# Patient Record
Sex: Female | Born: 1995 | Smoking: Never smoker
Health system: Southern US, Community
[De-identification: ages and names within clinical notes are randomized; demographics above are authoritative.]

## PROBLEM LIST (undated history)

## (undated) DIAGNOSIS — Z86718 Personal history of other venous thrombosis and embolism: Secondary | ICD-10-CM

## (undated) DIAGNOSIS — I2699 Other pulmonary embolism without acute cor pulmonale: Secondary | ICD-10-CM

## (undated) DIAGNOSIS — F909 Attention-deficit hyperactivity disorder, unspecified type: Secondary | ICD-10-CM

## (undated) HISTORY — PX: WRIST SURGERY: SHX841

## (undated) HISTORY — PX: TONSILLECTOMY: SUR1361

## (undated) HISTORY — PX: KNEE SURGERY: SHX244

---

## 2020-01-16 ENCOUNTER — Ambulatory Visit: Payer: Self-pay | Attending: Family

## 2020-01-16 DIAGNOSIS — Z23 Encounter for immunization: Secondary | ICD-10-CM

## 2020-02-12 NOTE — Progress Notes (Signed)
   Covid-19 Vaccination Clinic  Name:  Misty Long    MRN: [id number] DOB: October 04, 1995  02/12/2020  Ms. Misty Long was observed post Covid-19 immunization for 15 minutes without incident. She was provided with Vaccine Information Sheet and instruction to access the V-Safe system.   [name] was instructed to call 911 with any severe reactions post vaccine: Marland Kitchen Difficulty breathing  . Swelling of face and throat  . A fast heartbeat  . A bad rash all over body  . Dizziness and weakness   Immunizations Administered    Name Date Dose VIS Date Route   Moderna COVID-19 Vaccine 01/16/2020  4:00 PM 0.5 mL 03/2019 Intramuscular   Manufacturer: Moderna   Lot: [id number]   NDC: 10272-536-64

## 2020-05-16 ENCOUNTER — Encounter (HOSPITAL_COMMUNITY): Payer: Self-pay

## 2020-05-16 ENCOUNTER — Ambulatory Visit (HOSPITAL_COMMUNITY)
Admission: EM | Admit: 2020-05-16 | Discharge: 2020-05-16 | Disposition: A | Discharge status: Home / Self Care | Payer: 59 | Attending: Emergency Medicine | Admitting: Emergency Medicine

## 2020-05-16 ENCOUNTER — Other Ambulatory Visit: Payer: Self-pay

## 2020-05-16 DIAGNOSIS — S81812A Laceration without foreign body, left lower leg, initial encounter: Secondary | ICD-10-CM

## 2020-05-16 HISTORY — DX: Attention-deficit hyperactivity disorder, unspecified type: F90.9

## 2020-05-16 HISTORY — DX: Personal history of other venous thrombosis and embolism: Z86.718

## 2020-05-16 MED ORDER — IBUPROFEN 600 MG PO TABS: 600.0000 mg | ORAL_TABLET | Freq: Four times a day (QID) | ORAL | 0 refills | Status: DC | PRN

## 2020-05-16 NOTE — ED Triage Notes (Signed)
Pt presents with abrasion on right leg and right leg pain after banging it against a picture frame while trying to do a handstand against the wall.

## 2020-05-16 NOTE — ED Notes (Signed)
Ice pack provided for left leg pain.

## 2020-05-16 NOTE — ED Provider Notes (Signed)
HPI  SUBJECTIVE:  Misty Long is a 25 y.o. female who presents with a painful laceration and swelling to her distal left lower extremity after trying to do a handstand against the wall yesterday.  She states that she hit her leg against the corner of a wooden picture frame.  She is not sure if the picture frame is intact.  She states that she "feels as if there is something in there" because of the intensity of the pain.  She describes the pain is constant, worse with dorsiflexion/plantarflexion, palpation.  No numbness or tingling distally, no bruising.  She tried hydrogen peroxide, Aquaphor ointment and ice.  The ice helps.  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  She states she now takes Eliquis intermittently.  No recent Eliquis use.  No history of diabetes, HIV.  All immunizations are up-to-date.  Tetanus was within the past 10 years.  LMP: 2 weeks ago.  Denies the possibility being pregnant.  XTG:GYIRSW, Adija D, PA-C   Past Medical History:  Diagnosis Date  . ADHD   . H/O blood clots     Past Surgical History:  Procedure Laterality Date  . KNEE SURGERY    . TONSILLECTOMY    . WRIST SURGERY      Family History  Family history unknown: Yes    Social History   Tobacco Use  . Smoking status: Current Every Day Smoker  Substance Use Topics  . Drug use: Yes    Types: Marijuana    No current facility-administered medications for this encounter.  Current Outpatient Medications:  .  amphetamine-dextroamphetamine (ADDERALL XR) 10 MG 24 hr capsule, Take 15 mg by mouth daily., Disp: , Rfl:  .  ibuprofen (ADVIL) 600 MG tablet, Take 1 tablet (600 mg total) by mouth every 6 (six) hours as needed., Disp: 30 tablet, Rfl: 0  Allergies  Allergen Reactions  . Codeine      ROS  As noted in HPI.   Physical Exam  BP 114/66 (BP Location: Right Arm)   Pulse  80   Temp 98.3 F (36.8 C) (Oral)   Resp 20   LMP 05/03/2020   SpO2 100%   Constitutional: Well developed, well nourished, no acute distress Eyes:  EOMI, conjunctiva normal bilaterally HENT: Normocephalic, atraumatic,mucus membranes moist Respiratory: Normal inspiratory effort Cardiovascular: Normal rate GI: nondistended skin: 0.5 cm tender laceration that is partially healed distal anterior left lower extremity.  Explored under magnification.  No appreciable foreign body seen.  No expressible purulent drainage.  No surrounding erythema, induration, ecchymosis.  No increased temperature.  Calves grossly symmetric.      Musculoskeletal: no deformities Neurologic: Alert & oriented x 3, no focal neuro deficits Psychiatric: Speech and behavior appropriate   ED Course   Medications - No data to display  Orders Placed This Encounter  Procedures  . Apply ice to affected area    Standing Status:   Standing    Number of Occurrences:   1    No results found for this or any previous visit (from the past 24 hour(s)). No results found.  ED Clinical Impression  1. Laceration of left lower extremity, initial encounter      ED Assessment/Plan  Patient with a laceration to her left lower extremity.  Doubt resulting compartment syndrome as she is not on any anticoagulants.  Patient up-to-date  on tetanus. There is no evidence of infection.  Did not incise open and explore as it is well on its way to being healed.  There is no surrounding erythema, increased temperature that would suggest foreign body presence.  Deferred antibiotic therapy.  Will send home with ice, Tylenol/ibuprofen.  Giving ice pack here.  Return to clinic for any signs of infection.  Will reevaluate for presence of foreign body at that time.  Discussed  MDM, treatment plan, and plan for follow-up with patient. patient agrees with plan.   Meds ordered this encounter  Medications  . ibuprofen (ADVIL) 600 MG tablet     Sig: Take 1 tablet (600 mg total) by mouth every 6 (six) hours as needed.    Dispense:  30 tablet    Refill:  0    *This clinic note was created using Scientist, clinical (histocompatibility and immunogenetics). Therefore, there may be occasional mistakes despite careful proofreading.   ?    Domenick Gong, MD 05/17/20 [phone]

## 2020-05-16 NOTE — Discharge Instructions (Signed)
Keep clean with soap and water.  Ice for 20 minutes at a time.  Take 600 mg of ibuprofen combined with 1000 mg of Tylenol together 3-4 times a day as needed for pain.  Return here for any signs of infection.

## 2020-05-23 ENCOUNTER — Ambulatory Visit (HOSPITAL_COMMUNITY)
Admission: EM | Admit: 2020-05-23 | Discharge: 2020-05-23 | Disposition: A | Discharge status: Home / Self Care | Payer: Worker's Compensation | Attending: Emergency Medicine | Admitting: Emergency Medicine

## 2020-05-23 ENCOUNTER — Other Ambulatory Visit: Payer: Self-pay

## 2020-05-23 ENCOUNTER — Encounter (HOSPITAL_COMMUNITY): Payer: Self-pay

## 2020-05-23 DIAGNOSIS — W19XXXA Unspecified fall, initial encounter: Secondary | ICD-10-CM

## 2020-05-23 DIAGNOSIS — M7918 Myalgia, other site: Secondary | ICD-10-CM | POA: Diagnosis not present

## 2020-05-23 MED ORDER — METHOCARBAMOL 500 MG PO TABS: 500.0000 mg | ORAL_TABLET | Freq: Two times a day (BID) | ORAL | 0 refills | Status: DC

## 2020-05-23 NOTE — ED Triage Notes (Signed)
Pt present a fall today at work, pt states that she hurt her right side hip and hand. Pt state this Workers comp.

## 2020-05-23 NOTE — Discharge Instructions (Signed)
Take ibuprofen as needed for discomfort.    Take the muscle relaxer as needed for muscle spasm; Do not drive, operate machinery, or drink alcohol with this medication as it can cause drowsiness.   Follow up with your primary care provider if your symptoms are not improving.     

## 2020-05-23 NOTE — ED Provider Notes (Signed)
[facility]    CSN: [id number] Arrival date & time: 05/23/20  1416      History   Chief Complaint Chief Complaint  Patient presents with  . Fall    WC    HPI Misty Long is a 25 y.o. female.   Patient presents with pain in her right hip and thigh after falling at work today.  She states she slipped on a wet floor and fell landing on her right side.  She denies head injury or loss of consciousness.  No treatments attempted at home.  She denies numbness, weakness, paresthesias, wounds, redness, bruising, chest pain, shortness of breath, abdominal pain, or other symptoms.  Her medical history includes ADHD and pulmonary embolism in 2020.  The history is provided by the patient and medical records.    Past Medical History:  Diagnosis Date  . ADHD   . H/O blood clots     There are no problems to display for this patient.   Past Surgical History:  Procedure Laterality Date  . KNEE SURGERY    . TONSILLECTOMY    . WRIST SURGERY      OB History   No obstetric history on file.      Home Medications    Prior to Admission medications   Medication Sig Start Date End Date Taking? Authorizing Provider  methocarbamol (ROBAXIN) 500 MG tablet Take 1 tablet (500 mg total) by mouth 2 (two) times daily. 05/23/20  Yes Mickie Bail, NP  amphetamine-dextroamphetamine (ADDERALL XR) 10 MG 24 hr capsule Take 15 mg by mouth daily.    [provider]  ibuprofen (ADVIL) 600 MG tablet Take 1 tablet (600 mg total) by mouth every 6 (six) hours as needed. 05/16/20   Domenick Gong, MD    Family History Family History  Family history unknown: Yes    Social History Social History   Tobacco Use  . Smoking status: Current Every Day Smoker  Substance Use Topics  . Drug use: Yes    Types: Marijuana     Allergies   Codeine   Review of Systems Review of Systems  Constitutional: Negative for chills and fever.  HENT: Negative for ear pain and sore throat.    Eyes: Negative for pain and visual disturbance.  Respiratory: Negative for cough and shortness of breath.   Cardiovascular: Negative for chest pain and palpitations.  Gastrointestinal: Negative for abdominal pain and vomiting.  Genitourinary: Negative for dysuria and hematuria.  Musculoskeletal: Positive for arthralgias and myalgias. Negative for back pain, gait problem and joint swelling.  Skin: Negative for color change and rash.  Neurological: Negative for syncope, weakness and numbness.  All other systems reviewed and are negative.    Physical Exam Triage Vital Signs ED Triage Vitals  Enc Vitals Group     BP      Pulse      Resp      Temp      Temp src      SpO2      Weight      Height      Head Circumference      Peak Flow      Pain Score      Pain Loc      Pain Edu?      Excl. in GC?    No data found.  Updated Vital Signs BP 123/71 (BP Location: Left Arm)   Pulse 70   Temp 98.9 F (37.2 C) (Oral)  Resp 18   LMP 05/03/2020   SpO2 100%   Visual Acuity Right Eye Distance:   Left Eye Distance:   Bilateral Distance:    Right Eye Near:   Left Eye Near:    Bilateral Near:     Physical Exam Vitals and nursing note reviewed.  Constitutional:      General: She is not in acute distress.    Appearance: She is well-developed and well-nourished. She is obese. She is not ill-appearing.  HENT:     Head: Normocephalic and atraumatic.     Mouth/Throat:     Mouth: Mucous membranes are moist.  Eyes:     Conjunctiva/sclera: Conjunctivae normal.  Cardiovascular:     Rate and Rhythm: Normal rate and regular rhythm.     Heart sounds: Normal heart sounds.  Pulmonary:     Effort: Pulmonary effort is normal. No respiratory distress.     Breath sounds: Normal breath sounds.  Abdominal:     Palpations: Abdomen is soft.     Tenderness: There is no abdominal tenderness. There is no right CVA tenderness, left CVA tenderness, guarding or rebound.  Musculoskeletal:         General: No swelling, tenderness, deformity or edema. Normal range of motion.     Cervical back: Neck supple.  Skin:    General: Skin is warm and dry.     Findings: No bruising, erythema, lesion or rash.  Neurological:     General: No focal deficit present.     Mental Status: She is alert and oriented to person, place, and time.     Sensory: No sensory deficit.     Motor: No weakness.     Gait: Gait normal.  Psychiatric:        Mood and Affect: Mood and affect and mood normal.        Behavior: Behavior normal.      UC Treatments / Results  Labs (all labs ordered are listed, but only abnormal results are displayed) Labs Reviewed - No data to display  EKG   Radiology No results found.  Procedures Procedures (including critical care time)  Medications Ordered in UC Medications - No data to display  Initial Impression / Assessment and Plan / UC Course  I have reviewed the triage vital signs and the nursing notes.  Pertinent labs & imaging results that were available during my care of the patient were reviewed by me and considered in my medical decision making (see chart for details).   Musculoskeletal pain due to fall.  Patient declines prescription for ibuprofen; she states she has some at home.  Instructed her to take ibuprofen as needed for discomfort.  Also treating with methocarbamol; precautions for drowsiness with this medication discussed.  Instructed patient to follow-up with her PCP if her symptoms are not improving.  She agrees to plan of care.   Final Clinical Impressions(s) / UC Diagnoses   Final diagnoses:  Musculoskeletal pain  Fall, initial encounter     Discharge Instructions     Take ibuprofen as needed for discomfort.  Take the muscle relaxer as needed for muscle spasm; Do not drive, operate machinery, or drink alcohol with this medication as it can cause drowsiness.   Follow up with your primary care provider if your symptoms are not improving.         ED Prescriptions    Medication Sig Dispense Auth. Provider   methocarbamol (ROBAXIN) 500 MG tablet Take 1 tablet (500 mg total) by  mouth 2 (two) times daily. 20 tablet Mickie Bail, NP     PDMP not reviewed this encounter.   Mickie Bail, NP 05/23/20 [phone]

## 2020-08-25 ENCOUNTER — Encounter: Payer: Self-pay | Admitting: Family Medicine

## 2020-08-25 ENCOUNTER — Other Ambulatory Visit: Payer: Self-pay

## 2020-08-25 ENCOUNTER — Ambulatory Visit (INDEPENDENT_AMBULATORY_CARE_PROVIDER_SITE_OTHER): Payer: 59 | Admitting: Family Medicine

## 2020-08-25 VITALS — BP 124/81 | Ht 65.0 in | Wt 255.0 lb

## 2020-08-25 DIAGNOSIS — S83242A Other tear of medial meniscus, current injury, left knee, initial encounter: Secondary | ICD-10-CM

## 2020-08-25 NOTE — Progress Notes (Addendum)
PCP: Tally Joe, PA-C  Subjective:   HPI: Patient is a 25 y.o. female with history of left medial meniscus tear status post partial meniscectomy during high school here for evaluation of left medial knee pain and swelling.  She reports that recently she started doing a new exercise routine and has been going to the gym more.  Part of her exercise program was doing a lot of deep squats and explosive exercises with lower body.  She did not note a specific incident that seem to set off her pain, however the day after she was doing side exercise, she woke up and had significant pain and swelling in her left knee.  The pain is mostly on the medial aspect of her knee, and since it started a couple weeks ago, she has had clicking and occasionally her knee gets stuck in a certain position and she has to maneuver her knee in a certain way to get it to bend again.  She is able to walk but does have pain with walking as well.  She has tried oral anti-inflammatories which were mildly helpful, massage, and relative rest.   Review of Systems:  Per HPI.   PMFSH, medications and smoking status reviewed.      Objective:  Physical Exam:  No flowsheet data found.   Gen: awake, alert, NAD, comfortable in exam room Pulm: breathing unlabored  Left knee  Inspection: Somewhat difficult to appreciate due to body habitus.  Bilateral knees without evidence of erythema, ecchymosis, swelling, edema. No significant effusion visible. Active ROM: Intact. 0-150d.  Pain with deep flexion. Strength: 5/5 strength to resisted flexion/extension with pain with deep flexion. Patella: Negative patellar grind. No patellar facet tenderness. No apprehension. No proximal or distal patellar tendon tenderness to palpation. No quad tendon tenderness to palpation.  Tibia: No tibial plateau, tibial tuberosity tenderness.  Joint line: + Medial joint line tenderness.  Popliteal: No popliteal tenderness to the insertional gastroc.  No insertional biceps femoris, semimembranosis, semitendinosis tenderness.  McMurrays/Thessaly's: Positive for pain and catching along the medial joint line Lachmans: Stable bilaterally with firm endpoint  Anterior/Posterior drawer: Stable bilaterally Varus/valgus stress at 0, 15d: Negative for pain, laxity  Limited ultrasound examination of the left knee: -Suprapatellar pouch: Visualized in both long and short axis and does show a mild effusion. -Quadriceps tendon: Insertion onto superior pole patella visualized and without significant abnormality -Medial joint line: Medial meniscus visualized.  There is narrowing of the medial joint line with a small osteophyte at the area of previous meniscal surgery.  She does have some extrusion of the medial meniscus without any visible discrete tear.  Impression: -Left knee effusion -Abnormal meniscal findings concerning for meniscus tear versus postoperative changes  Ultrasound performed interpreted by Guy Sandifer, MD, and Norton Blizzard, MD.        Assessment & Plan:  1.  Acute traumatic left medial meniscus tear Patient with history and examination concerning for medial meniscus tear.  This is complicated by history of medial meniscus tear of the same knee status post partial meniscectomy, however she is currently having mechanical symptoms and MRI is warranted.  Plan: -X-ray left knee -MRI left knee -Avoid deep squatting, pivoting, or aggravating activities -Tylenol, ibuprofen and ice in the short-term -I will call patient with results of MRI and x-ray.   Guy Sandifer, MD [facility] Fellow 08/25/2020 4:27 PM  Addendum:  I was the preceptor for this visit and available for immediate consultation.  Norton Blizzard MD  CAQSM  Addendum 09/10/2020: Reviewed MRI results with patient which showed fraying of the posterior horn of the lateral meniscus without discrete tear, tricompartmental arthritis and a joint effusion.  We  discussed that these findings can occur post meniscectomy it does not appear that there is any surgical intervention required at this point.  She feels reassured about this, we discussed that she can return anytime for nonoperative pain management strategies as desired.  She states that she has been resting since our visit and feels like her pain is improved, so she will manage on her own right now which I think is reasonable.  All questions answered.  Guy Sandifer, MD Sports Medicine Fellow, [facility]

## 2020-08-27 ENCOUNTER — Other Ambulatory Visit: Payer: Self-pay

## 2020-08-27 ENCOUNTER — Ambulatory Visit
Admission: RE | Admit: 2020-08-27 | Discharge: 2020-08-27 | Disposition: A | Discharge status: Home / Self Care | Payer: 59 | Source: Ambulatory Visit | Attending: Family Medicine | Admitting: Family Medicine

## 2020-08-27 DIAGNOSIS — S83242A Other tear of medial meniscus, current injury, left knee, initial encounter: Secondary | ICD-10-CM

## 2020-09-04 ENCOUNTER — Other Ambulatory Visit: Payer: 59

## 2020-09-08 ENCOUNTER — Other Ambulatory Visit: Payer: Self-pay

## 2020-09-08 ENCOUNTER — Ambulatory Visit
Admission: RE | Admit: 2020-09-08 | Discharge: 2020-09-08 | Disposition: A | Discharge status: Home / Self Care | Payer: 59 | Source: Ambulatory Visit | Attending: Family Medicine | Admitting: Family Medicine

## 2020-09-08 DIAGNOSIS — S83242A Other tear of medial meniscus, current injury, left knee, initial encounter: Secondary | ICD-10-CM

## 2021-05-03 ENCOUNTER — Ambulatory Visit: Payer: 59 | Admitting: Family Medicine

## 2021-05-03 NOTE — Progress Notes (Signed)
Opened in error

## 2021-07-02 ENCOUNTER — Other Ambulatory Visit: Payer: Self-pay

## 2021-07-02 ENCOUNTER — Ambulatory Visit (INDEPENDENT_AMBULATORY_CARE_PROVIDER_SITE_OTHER): Payer: 59

## 2021-07-02 ENCOUNTER — Encounter (HOSPITAL_COMMUNITY): Payer: Self-pay | Admitting: Emergency Medicine

## 2021-07-02 ENCOUNTER — Ambulatory Visit (HOSPITAL_COMMUNITY)
Admission: EM | Admit: 2021-07-02 | Discharge: 2021-07-02 | Disposition: A | Discharge status: Home / Self Care | Payer: 59 | Attending: Family Medicine | Admitting: Family Medicine

## 2021-07-02 DIAGNOSIS — R062 Wheezing: Secondary | ICD-10-CM

## 2021-07-02 DIAGNOSIS — R051 Acute cough: Secondary | ICD-10-CM

## 2021-07-02 DIAGNOSIS — R059 Cough, unspecified: Secondary | ICD-10-CM

## 2021-07-02 HISTORY — DX: Other pulmonary embolism without acute cor pulmonale: I26.99

## 2021-07-02 MED ORDER — PREDNISONE 20 MG PO TABS: 40.0000 mg | ORAL_TABLET | Freq: Every day | ORAL | 0 refills | Status: DC

## 2021-07-02 MED ORDER — AZITHROMYCIN 250 MG PO TABS: 250.0000 mg | ORAL_TABLET | Freq: Every day | ORAL | 0 refills | Status: DC

## 2021-07-02 NOTE — ED Triage Notes (Signed)
Onset of symptoms 10 days ago.   ?Patient was sick prior to trip to [place], but symptoms may have worsened.  Patient has runny nose, cough, coughing episodes.  Overall feeling tired.    ?

## 2021-07-04 NOTE — ED Provider Notes (Signed)
?[facility] ? ? ?[id number] ?07/02/21 Arrival Time: 1258 ? ?ASSESSMENT & PLAN: ? ?1. Acute cough   ?2. Wheezing   ? ?No resp distress. ?I have personally viewed the imaging studies ordered this visit. ?No acute changes appreciated. But given duration of symptoms will treat with: ?Discharge Medication List as of 07/02/2021  2:42 PM  ?  ? ?START taking these medications  ? Details  ?azithromycin (ZITHROMAX) 250 MG tablet Take 1 tablet (250 mg total) by mouth daily. Take first 2 tablets together, then 1 every day until finished., Starting Sat 07/02/2021, Normal  ?  ?predniSONE (DELTASONE) 20 MG tablet Take 2 tablets (40 mg total) by mouth daily., Starting Sat 07/02/2021, Normal  ?  ?  ? ? ? Follow-up Information   ? ? Tally Joe, PA-C.   ?Specialty: Physician Assistant ?Why: If worsening or failing to improve as anticipated. ?Contact information: ?[address] ?[address] ?[place] [address] ?[phone] ? ? ?  ?  ? ?  ?  ? ?  ? ? ?Reviewed expectations re: course of current medical issues. Questions answered. ?Outlined signs and symptoms indicating need for more acute intervention. ?Understanding verbalized. ?After Visit Summary given. ? ? ?SUBJECTIVE: ?History from: Patient. ?Misty Long is a 26 y.o. female. Reports: coughing and congested; x 10 days; no better; overall fatigued. Ques subj fever/chills over past few days. Denies: difficulty breathing. Normal PO intake without n/v/d. ? ?Social History  ? ?Tobacco Use  ?Smoking Status Never  ?Smokeless Tobacco Never  ? ? ?OBJECTIVE: ? ?Vitals:  ? 07/02/21 1343  ?BP: 124/72  ?Pulse: 70  ?Resp: 20  ?Temp: 98.9 ?F (37.2 ?C)  ?TempSrc: Oral  ?SpO2: 95%  ?  ?General appearance: alert; no distress ?Eyes: PERRLA; EOMI; conjunctiva normal ?HENT: [place]; AT; with nasal congestion ?Neck: supple  ?Lungs: speaks full sentences without difficulty; unlabored; slightly coarse breath sounds bilat; mild exp wheezing ?Extremities: no edema ?Skin: warm and  dry ?Neurologic: normal gait ?Psychological: alert and cooperative; normal mood and affect ? ? ?Imaging: ?DG Chest 2 View ? ?Result Date: 07/02/2021 ?CLINICAL DATA:  Persistent cough, worsened symptoms over 10 days EXAM: CHEST - 2 VIEW COMPARISON:  None FINDINGS: Normal heart size, mediastinal contours, and pulmonary vascularity. Lungs clear. No pulmonary infiltrate, pleural effusion, or pneumothorax. Osseous structures unremarkable. IMPRESSION: No acute abnormalities. Electronically Signed   By: Ulyses Southward M.D.   On: 07/02/2021 14:18  ? ? ?Allergies  ?Allergen Reactions  ? Codeine   ? ? ?Past Medical History:  ?Diagnosis Date  ? ADHD   ? H/O blood clots   ? PE (pulmonary thromboembolism) (HCC)   ? ?Social History  ? ?Socioeconomic History  ? Marital status: Unknown  ?  Spouse name: Not on file  ? Number of children: Not on file  ? Years of education: Not on file  ? Highest education level: Not on file  ?Occupational History  ? Not on file  ?Tobacco Use  ? Smoking status: Never  ? Smokeless tobacco: Never  ?Vaping Use  ? Vaping Use: Never used  ?Substance and Sexual Activity  ? Alcohol use: Yes  ? Drug use: Yes  ?  Types: Marijuana  ? Sexual activity: Not on file  ?Other Topics Concern  ? Not on file  ?Social History Narrative  ? Not on file  ? ?Social Determinants of Health  ? ?Financial Resource Strain: Not on file  ?Food Insecurity: Not on file  ?Transportation Needs: Not on file  ?  Physical Activity: Not on file  ?Stress: Not on file  ?Social Connections: Not on file  ?Intimate Partner Violence: Not on file  ? ?Family History  ?Problem Relation Age of Onset  ? Healthy Mother   ? Healthy Father   ? ?Past Surgical History:  ?Procedure Laterality Date  ? KNEE SURGERY    ? TONSILLECTOMY    ? WRIST SURGERY    ? ?  ?Mardella Layman, MD ?07/04/21 [phone] ? ?

## 2021-08-25 ENCOUNTER — Other Ambulatory Visit: Payer: Self-pay

## 2021-08-25 ENCOUNTER — Encounter (HOSPITAL_COMMUNITY): Payer: Self-pay | Admitting: Emergency Medicine

## 2021-08-25 ENCOUNTER — Emergency Department (HOSPITAL_COMMUNITY): Payer: Commercial Managed Care - PPO

## 2021-08-25 ENCOUNTER — Emergency Department (HOSPITAL_COMMUNITY)
Admission: EM | Admit: 2021-08-25 | Discharge: 2021-08-26 | Discharge status: Against Medical Advice | Payer: Commercial Managed Care - PPO | Attending: Emergency Medicine | Admitting: Emergency Medicine

## 2021-08-25 DIAGNOSIS — M25561 Pain in right knee: Secondary | ICD-10-CM | POA: Diagnosis not present

## 2021-08-25 DIAGNOSIS — M79661 Pain in right lower leg: Secondary | ICD-10-CM | POA: Insufficient documentation

## 2021-08-25 DIAGNOSIS — Z5321 Procedure and treatment not carried out due to patient leaving prior to being seen by health care provider: Secondary | ICD-10-CM | POA: Diagnosis not present

## 2021-08-25 MED ORDER — IBUPROFEN 400 MG PO TABS
400.0000 mg | ORAL_TABLET | Freq: Once | ORAL | Status: AC | PRN
  Administered 2021-08-25: 400 mg via ORAL
  Filled 2021-08-25: qty 1

## 2021-08-25 NOTE — ED Triage Notes (Signed)
Pt reported to ED for evaluation of paint to right leg  that occurred while playing football. states "I popped something in the middle of my calf and the pain is radiating to my ankle".  ?

## 2021-08-25 NOTE — ED Provider Triage Note (Signed)
Emergency Medicine Provider Triage Evaluation Note ? ?Dashonda Bonneau , a 26 y.o. female  was evaluated in triage.  Pt complains of right lower extremity pain to knee and ankle.  Happened today at a power puff game, patient landed wrong on her foot denies being tackled or hitting her head.  Pain is excruciating per patient. ? ?Review of Systems  ?Per hpi ? ?Physical Exam  ?BP 134/80 (BP Location: Left Arm)   Pulse (!) 113   Temp 98.5 ?F (36.9 ?C) (Oral)   Resp 17   SpO2 97%  ?Gen:   Awake, no distress patient hyperventilating and writhing in pain in room ?Resp:  Normal effort  ?MSK:   Will not tolerate range of motion to right ankle or knee.  Rising screams in pain with any palpation of the right lower extremity. ?Other:  ? ?Medical Decision Making  ?Medically screening exam initiated at 10:01 PM.  Appropriate orders placed.  Aalia Greulich was informed that the remainder of the evaluation will be completed by another provider, this initial triage assessment does not replace that evaluation, and the importance of remaining in the ED until their evaluation is complete. ? ?Imaging  ?  ?Theron Arista, PA-C ?08/25/21 2202 ? ?

## 2021-08-26 NOTE — ED Notes (Signed)
Patient states she is leaving d/t wait time 

## 2021-10-15 ENCOUNTER — Ambulatory Visit (HOSPITAL_COMMUNITY)
Admission: EM | Admit: 2021-10-15 | Discharge: 2021-10-15 | Disposition: A | Discharge status: Home / Self Care | Payer: Commercial Managed Care - PPO | Attending: Emergency Medicine | Admitting: Emergency Medicine

## 2021-10-15 ENCOUNTER — Encounter (HOSPITAL_COMMUNITY): Payer: Self-pay | Admitting: Emergency Medicine

## 2021-10-15 DIAGNOSIS — S86092D Other specified injury of left Achilles tendon, subsequent encounter: Secondary | ICD-10-CM

## 2021-10-15 DIAGNOSIS — L299 Pruritus, unspecified: Secondary | ICD-10-CM | POA: Diagnosis not present

## 2021-10-15 DIAGNOSIS — Z9889 Other specified postprocedural states: Secondary | ICD-10-CM

## 2021-10-15 DIAGNOSIS — Z5189 Encounter for other specified aftercare: Secondary | ICD-10-CM

## 2021-10-15 MED ORDER — CETIRIZINE HCL 10 MG PO TABS: 10.0000 mg | ORAL_TABLET | Freq: Every day | ORAL | 0 refills | Status: DC

## 2021-10-15 MED ORDER — CEPHALEXIN 500 MG PO CAPS: 500.0000 mg | ORAL_CAPSULE | Freq: Two times a day (BID) | ORAL | 0 refills | Status: AC

## 2022-02-28 ENCOUNTER — Encounter (HOSPITAL_COMMUNITY): Payer: Self-pay | Admitting: Emergency Medicine

## 2022-02-28 ENCOUNTER — Other Ambulatory Visit: Payer: Self-pay

## 2022-02-28 ENCOUNTER — Ambulatory Visit (HOSPITAL_COMMUNITY)
Admission: EM | Admit: 2022-02-28 | Discharge: 2022-02-28 | Disposition: A | Discharge status: Home / Self Care | Payer: Commercial Managed Care - PPO | Attending: Internal Medicine | Admitting: Internal Medicine

## 2022-02-28 ENCOUNTER — Ambulatory Visit (INDEPENDENT_AMBULATORY_CARE_PROVIDER_SITE_OTHER): Payer: Commercial Managed Care - PPO

## 2022-02-28 ENCOUNTER — Telehealth (HOSPITAL_COMMUNITY): Payer: Self-pay | Admitting: Emergency Medicine

## 2022-02-28 DIAGNOSIS — R0781 Pleurodynia: Secondary | ICD-10-CM | POA: Diagnosis present

## 2022-02-28 DIAGNOSIS — R6883 Chills (without fever): Secondary | ICD-10-CM | POA: Diagnosis not present

## 2022-02-28 DIAGNOSIS — R0602 Shortness of breath: Secondary | ICD-10-CM | POA: Diagnosis not present

## 2022-02-28 DIAGNOSIS — R079 Chest pain, unspecified: Secondary | ICD-10-CM

## 2022-02-28 DIAGNOSIS — R5383 Other fatigue: Secondary | ICD-10-CM | POA: Diagnosis not present

## 2022-02-28 LAB — CBC
HCT: 42 % (ref 36.0–46.0)
Hemoglobin: 13.7 g/dL (ref 12.0–15.0)
MCH: 26.3 pg (ref 26.0–34.0)
MCHC: 32.6 g/dL (ref 30.0–36.0)
MCV: 80.6 fL (ref 80.0–100.0)
Platelets: 264 10*3/uL (ref 150–400)
RBC: 5.21 MIL/uL — ABNORMAL HIGH (ref 3.87–5.11)
RDW: 15.4 % (ref 11.5–15.5)
WBC: 7.4 10*3/uL (ref 4.0–10.5)
nRBC: 0 % (ref 0.0–0.2)

## 2022-02-28 LAB — BASIC METABOLIC PANEL
Anion gap: 7 (ref 5–15)
BUN: 5 mg/dL — ABNORMAL LOW (ref 6–20)
CO2: 25 mmol/L (ref 22–32)
Calcium: 8.6 mg/dL — ABNORMAL LOW (ref 8.9–10.3)
Chloride: 105 mmol/L (ref 98–111)
Creatinine, Ser: 0.93 mg/dL (ref 0.44–1.00)
GFR, Estimated: >60 mL/min (ref >60)
Glucose, Bld: 79 mg/dL (ref 70–99)
Potassium: 3.8 mmol/L (ref 3.5–5.1)
Sodium: 137 mmol/L (ref 135–145)

## 2022-02-28 MED ORDER — CYCLOBENZAPRINE HCL 10 MG PO TABS: 10.0000 mg | ORAL_TABLET | Freq: Two times a day (BID) | ORAL | 0 refills | Status: DC | PRN

## 2022-02-28 MED ORDER — ACETAMINOPHEN 325 MG PO TABS
975.0000 mg | ORAL_TABLET | Freq: Once | ORAL | Status: AC
  Administered 2022-02-28: 975 mg via ORAL

## 2022-02-28 MED ORDER — ACETAMINOPHEN 325 MG PO TABS
ORAL_TABLET | ORAL | Status: AC
  Filled 2022-02-28: qty 3

## 2022-02-28 NOTE — ED Triage Notes (Signed)
Pt reports that having intermittent central chest pains that started yesterday. Reports when bends over can't breathe. Hx PEs. Reports took an Eliquis last night because got scared. Not taking Eliquis daily currently.

## 2022-02-28 NOTE — Discharge Instructions (Addendum)
Your chest pain is like related to a muscle strain that will improve with rest, increase fluids, and as needed use of muscle relaxer.  Your chest x-ray was normal. Your EKG of your heart was also normal.  You may take Flexeril muscle relaxer every 12 hours as needed for muscle spasm and pain to the chest wall.  Do not take this medicine and go to work, drive, or drink alcohol as it can make you very sleepy.  Perform gentle range of motion exercises and apply heat to the chest wall to help with pain as well.  We gave you a dose of Tylenol in the clinic. You may take ibuprofen starting tomorrow every 6 hours as needed for any pain that you may have and inflammation by taking 600 mg of ibuprofen every 6 hours as needed. Do not take anymore Eliquis. Avoid going to the gym for the next few days to allow your body to heal.  We will call you if any of your blood work is abnormal.  If you develop any new or worsening shortness of breath, chest pain, fever/chills, cough, or any other new or worsening symptoms, I would like for you to go to the emergency department for further work-up due to history of pulmonary embolism.  Follow-up with urgent care as needed. I hope you feel better!

## 2022-02-28 NOTE — Telephone Encounter (Signed)
Patient wanted prescription sent to a different pharmacy 

## 2022-02-28 NOTE — ED Provider Notes (Signed)
[place]    CSN: [id number] Arrival date & time: 02/28/22  0804      History   Chief Complaint Chief Complaint  Patient presents with   Chest Pain    HPI Misty Long is a 26 y.o. female.   Patient presents to urgent care for evaluation of intermittent nonradiating chest pain and shortness of breath that started yesterday and is worse with forward bending at the hips.  She states that whenever she bends over at the hips, she cannot breathe.  Central chest pain is worse when taking a deep breath.  She cannot identify any relieving factors for the chest pain and states that it is in the middle of her chest.  Pain is currently a 7 on a scale of 0-10 and is not affected by eating.  She describes the pain as a squeezing sensation.  She is not a smoker and denies acid reflux, other drug use, recent intake of spicy/acidic foods, recent changes to medications, nausea, vomiting, dizziness, diaphoresis and abdominal pain.  No fever/chills, sore throat, cough, heart palpitations, or orthopnea.  She has not had any recent long car or airplane travel, calf pain/swelling/erythema, recent orthopedic surgeries, or recent steroid/antibiotic use.  She does have a history of a pulmonary embolism in 2020 for which she was placed on Eliquis until 2021.  Patient had leftover Eliquis and took 1 dose of this yesterday when she began to experience chest pain and shortness of breath thinking that this would help with a potential pulmonary embolism if one were to be present.  She does report that she recently lifted a 65 pound box of punch/juice a couple of days ago which was very heavy and outside of her normal lifting activity. She also frequently works out at [employer] but denies changes in her fitness habits recently.  She has not attempted use of any over-the-counter medications prior to arrival urgent care for symptoms.   Chest Pain   Past Medical History:  Diagnosis Date   ADHD    H/O blood  clots    PE (pulmonary thromboembolism) ([place])     There are no problems to display for this patient.   Past Surgical History:  Procedure Laterality Date   KNEE SURGERY     TONSILLECTOMY     WRIST SURGERY      OB History   No obstetric history on file.      Home Medications    Prior to Admission medications   Medication Sig Start Date End Date Taking? Authorizing Provider  cyclobenzaprine (FLEXERIL) 10 MG tablet Take 1 tablet (10 mg total) by mouth 2 (two) times daily as needed for muscle spasms. 02/28/22  Yes Talbot Grumbling, FNP  amphetamine-dextroamphetamine (ADDERALL XR) 10 MG 24 hr capsule Take 15 mg by mouth daily.    [provider]  buPROPion (WELLBUTRIN XL) 150 MG 24 hr tablet Take 150 mg by mouth daily. 06/09/21   [provider]  ibuprofen (ADVIL) 600 MG tablet Take 1 tablet (600 mg total) by mouth every 6 (six) hours as needed. 05/16/20   Melynda Ripple, MD    Family History Family History  Problem Relation Age of Onset   Healthy Mother    Healthy Father     Social History Social History   Tobacco Use   Smoking status: Never   Smokeless tobacco: Never  Vaping Use   Vaping Use: Never used  Substance Use Topics   Alcohol use: Yes   Drug  use: Yes    Types: Marijuana     Allergies   Codeine   Review of Systems Review of Systems  Cardiovascular:  Positive for chest pain.  Per HPI  Physical Exam Triage Vital Signs ED Triage Vitals  Enc Vitals Group     BP 02/28/22 0826 115/79     Pulse Rate 02/28/22 0826 64     Resp 02/28/22 0826 16     Temp 02/28/22 0826 98.2 F (36.8 C)     Temp Source 02/28/22 0826 Oral     SpO2 02/28/22 0826 98 %     Weight --      Height --      Head Circumference --      Peak Flow --      Pain Score 02/28/22 0824 7     Pain Loc --      Pain Edu? --      Excl. in GC? --    No data found.  Updated Vital Signs BP 115/79 (BP Location: Left Arm)   Pulse 64   Temp 98.2 F (36.8 C)  (Oral)   Resp 16   LMP 02/10/2022 (Approximate)   SpO2 98%   Visual Acuity Right Eye Distance:   Left Eye Distance:   Bilateral Distance:    Right Eye Near:   Left Eye Near:    Bilateral Near:     Physical Exam Vitals and nursing note reviewed. Exam conducted with a chaperone present Lyla Son, Char[employer] fundraiser).  Constitutional:      Appearance: She is obese. She is not ill-appearing or toxic-appearing.  HENT:     Head: Normocephalic and atraumatic.     Right Ear: Hearing, tympanic membrane, ear canal and external ear normal.     Left Ear: Hearing, tympanic membrane, ear canal and external ear normal.     Nose: Nose normal.     Mouth/Throat:     Lips: Pink.  Eyes:     General: Lids are normal. Vision grossly intact. Gaze aligned appropriately.     Extraocular Movements: Extraocular movements intact.     Conjunctiva/sclera: Conjunctivae normal.  Cardiovascular:     Rate and Rhythm: Normal rate and regular rhythm.     Heart sounds: Normal heart sounds, S1 normal and S2 normal.  Pulmonary:     Effort: Pulmonary effort is normal. No respiratory distress.     Breath sounds: Normal breath sounds and air entry.     Comments: No adventitious lung sounds are to auscultation.  No acute respiratory distress.  Patient able to speak in full sentences without difficulty or tachypnea. Chest:     Chest wall: Tenderness present.       Comments: Subjective chest pain is reproducible with palpation of the chest wall.  No visible abnormality or obvious deformity to inspection.  No sign of injury, ecchymosis, or swelling. Musculoskeletal:     Cervical back: Neck supple.  Lymphadenopathy:     Cervical: No cervical adenopathy.  Skin:    General: Skin is warm and dry.     Capillary Refill: Capillary refill takes less than 2 seconds.     Findings: No rash.  Neurological:     General: No focal deficit present.     Mental Status: She is alert and oriented to person, place, and time. Mental status is at  baseline.     Cranial Nerves: No dysarthria or facial asymmetry.  Psychiatric:        Mood and Affect: Mood normal.  Speech: Speech normal.        Behavior: Behavior normal.        Thought Content: Thought content normal.        Judgment: Judgment normal.      UC Treatments / Results  Labs (all labs ordered are listed, but only abnormal results are displayed) Labs Reviewed  CBC  BASIC METABOLIC PANEL    EKG   Radiology DG Chest 2 View  Result Date: 02/28/2022 CLINICAL DATA:  Chest pain, shortness of breath EXAM: CHEST - 2 VIEW COMPARISON:  07/02/2021 FINDINGS: Transverse diameter of heart is slightly increased. There are no signs of pulmonary edema or focal pulmonary consolidation. There is no pleural effusion or pneumothorax. IMPRESSION: There are no signs of pulmonary edema or focal pulmonary consolidation. Electronically Signed   By: Elmer Picker M.D.   On: 02/28/2022 09:07    Procedures Procedures (including critical care time)  Medications Ordered in UC Medications  acetaminophen (TYLENOL) tablet 975 mg (975 mg Oral Given 02/28/22 0902)    Initial Impression / Assessment and Plan / UC Course  I have reviewed the triage vital signs and the nursing notes.  Pertinent labs & imaging results that were available during my care of the patient were reviewed by me and considered in my medical decision making (see chart for details).   1.  Pleuritic chest pain and shortness of breath Chest x-ray is negative for acute cardiopulmonary abnormality including pneumothorax and infectious etiology.  Patient's vital signs are hemodynamically stable and she is nontoxic-appearing in the clinic without evidence of dehydration.  EKG is without red flag changes and shows normal sinus rhythm at a ventricular rate of 67 bpm.  Low suspicion for acute pulmonary embolism based on Wells criteria.  Her Wells criteria score is 0.  Chest pain is reproducible to palpation indicating that  this is likely an acute chest wall muscle strain related to recent heavy lifting.  Advised patient to stop taking Eliquis.  Patient would like to be checked for anemia in the clinic today since she has been more cold and tired lately, this is reasonable and labs are pending.  We will call patient if labs are abnormal requiring further treatment.   We will manage this with rest, increase fluids, and muscle relaxer use as needed.  She may start taking NSAIDs tomorrow since she took Eliquis today.  Tylenol given in the clinic to reduce pain.  She may take Flexeril every 12 hours as needed for muscle spasm, drowsiness precautions regarding Flexeril discussed.  Gentle range of motion exercises and heat to the chest wall to help with pain recommended.  She has been given strict ER return precautions and expresses understanding and agreement with this plan.   Discussed physical exam and available lab work findings in clinic with patient.  Counseled patient regarding appropriate use of medications and potential side effects for all medications recommended or prescribed today. Discussed red flag signs and symptoms of worsening condition,when to call the PCP office, return to urgent care, and when to seek higher level of care in the emergency department. Patient verbalizes understanding and agreement with plan. All questions answered. Patient discharged in stable condition.    Final Clinical Impressions(s) / UC Diagnoses   Final diagnoses:  Pleuritic chest pain  Shortness of breath     Discharge Instructions      Your chest pain is like related to a muscle strain that will improve with rest, increase fluids, and as needed use  of muscle relaxer.  Your chest x-ray was normal. Your EKG of your heart was also normal.  You may take Flexeril muscle relaxer every 12 hours as needed for muscle spasm and pain to the chest wall.  Do not take this medicine and go to work, drive, or drink alcohol as it can make  you very sleepy.  Perform gentle range of motion exercises and apply heat to the chest wall to help with pain as well.  We gave you a dose of Tylenol in the clinic. You may take ibuprofen starting tomorrow every 6 hours as needed for any pain that you may have and inflammation by taking 600 mg of ibuprofen every 6 hours as needed. Do not take anymore Eliquis. Avoid going to the gym for the next few days to allow your body to heal.  We will call you if any of your blood work is abnormal.  If you develop any new or worsening shortness of breath, chest pain, fever/chills, cough, or any other new or worsening symptoms, I would like for you to go to the emergency department for further work-up due to history of pulmonary embolism.  Follow-up with urgent care as needed. I hope you feel better!      ED Prescriptions     Medication Sig Dispense Auth. Provider   cyclobenzaprine (FLEXERIL) 10 MG tablet Take 1 tablet (10 mg total) by mouth 2 (two) times daily as needed for muscle spasms. 20 tablet Talbot Grumbling, FNP      PDMP not reviewed this encounter.   Talbot Grumbling, [place] 02/28/22 [phone]

## 2022-08-01 ENCOUNTER — Encounter (INDEPENDENT_AMBULATORY_CARE_PROVIDER_SITE_OTHER): Payer: Self-pay | Admitting: Primary Care

## 2022-08-01 ENCOUNTER — Other Ambulatory Visit (HOSPITAL_COMMUNITY)
Admission: RE | Admit: 2022-08-01 | Discharge: 2022-08-01 | Disposition: A | Discharge status: Home / Self Care | Payer: Commercial Managed Care - PPO | Source: Ambulatory Visit | Attending: Primary Care | Admitting: Primary Care

## 2022-08-01 ENCOUNTER — Ambulatory Visit (INDEPENDENT_AMBULATORY_CARE_PROVIDER_SITE_OTHER): Payer: Commercial Managed Care - PPO | Admitting: Primary Care

## 2022-08-01 VITALS — BP 109/76 | HR 85 | Ht 65.0 in | Wt 260.4 lb

## 2022-08-01 DIAGNOSIS — Z7689 Persons encountering health services in other specified circumstances: Secondary | ICD-10-CM | POA: Diagnosis not present

## 2022-08-01 DIAGNOSIS — N920 Excessive and frequent menstruation with regular cycle: Secondary | ICD-10-CM

## 2022-08-01 DIAGNOSIS — Z202 Contact with and (suspected) exposure to infections with a predominantly sexual mode of transmission: Secondary | ICD-10-CM

## 2022-08-01 DIAGNOSIS — Z833 Family history of diabetes mellitus: Secondary | ICD-10-CM

## 2022-08-01 DIAGNOSIS — Z86711 Personal history of pulmonary embolism: Secondary | ICD-10-CM | POA: Diagnosis not present

## 2022-08-01 DIAGNOSIS — Z803 Family history of malignant neoplasm of breast: Secondary | ICD-10-CM

## 2022-08-01 NOTE — Patient Instructions (Signed)
Calorie Counting for Weight Loss Calories are units of energy. Your body needs a certain number of calories from food to keep going throughout the day. When you eat or drink more calories than your body needs, your body stores the extra calories mostly as fat. When you eat or drink fewer calories than your body needs, your body burns fat to get the energy it needs. Calorie counting means keeping track of how many calories you eat and drink each day. Calorie counting can be helpful if you need to lose weight. If you eat fewer calories than your body needs, you should lose weight. Ask your health care provider what a healthy weight is for you. For calorie counting to work, you will need to eat the right number of calories each day to lose a healthy amount of weight per week. A dietitian can help you figure out how many calories you need in a day and will suggest ways to reach your calorie goal. A healthy amount of weight to lose each week is usually 1-2 lb (0.5-0.9 kg). This usually means that your daily calorie intake should be reduced by 500-750 calories. Eating 1,200-1,500 calories a day can help most women lose weight. Eating 1,500-1,800 calories a day can help most men lose weight. What do I need to know about calorie counting? Work with your health care provider or dietitian to determine how many calories you should get each day. To meet your daily calorie goal, you will need to: Find out how many calories are in each food that you would like to eat. Try to do this before you eat. Decide how much of the food you plan to eat. Keep a food log. Do this by writing down what you ate and how many calories it had. To successfully lose weight, it is important to balance calorie counting with a healthy lifestyle that includes regular activity. Where do I find calorie information?  The number of calories in a food can be found on a Nutrition Facts label. If a food does not have a Nutrition Facts label, try  to look up the calories online or ask your dietitian for help. Remember that calories are listed per serving. If you choose to have more than one serving of a food, you will have to multiply the calories per serving by the number of servings you plan to eat. For example, the label on a package of bread might say that a serving size is 1 slice and that there are 90 calories in a serving. If you eat 1 slice, you will have eaten 90 calories. If you eat 2 slices, you will have eaten 180 calories. How do I keep a food log? After each time that you eat, record the following in your food log as soon as possible: What you ate. Be sure to include toppings, sauces, and other extras on the food. How much you ate. This can be measured in cups, ounces, or number of items. How many calories were in each food and drink. The total number of calories in the food you ate. Keep your food log near you, such as in a pocket-sized notebook or on an app or website on your mobile phone. Some programs will calculate calories for you and show you how many calories you have left to meet your daily goal. What are some portion-control tips? Know how many calories are in a serving. This will help you know how many servings you can have of a certain   food. Use a measuring cup to measure serving sizes. You could also try weighing out portions on a kitchen scale. With time, you will be able to estimate serving sizes for some foods. Take time to put servings of different foods on your favorite plates or in your favorite bowls and cups so you know what a serving looks like. Try not to eat straight from a food's packaging, such as from a bag or box. Eating straight from the package makes it hard to see how much you are eating and can lead to overeating. Put the amount you would like to eat in a cup or on a plate to make sure you are eating the right portion. Use smaller plates, glasses, and bowls for smaller portions and to prevent  overeating. Try not to multitask. For example, avoid watching TV or using your computer while eating. If it is time to eat, sit down at a table and enjoy your food. This will help you recognize when you are full. It will also help you be more mindful of what and how much you are eating. What are tips for following this plan? Reading food labels Check the calorie count compared with the serving size. The serving size may be smaller than what you are used to eating. Check the source of the calories. Try to choose foods that are high in protein, fiber, and vitamins, and low in saturated fat, trans fat, and sodium. Shopping Read nutrition labels while you shop. This will help you make healthy decisions about which foods to buy. Pay attention to nutrition labels for low-fat or fat-free foods. These foods sometimes have the same number of calories or more calories than the full-fat versions. They also often have added sugar, starch, or salt to make up for flavor that was removed with the fat. Make a grocery list of lower-calorie foods and stick to it. Cooking Try to cook your favorite foods in a healthier way. For example, try baking instead of frying. Use low-fat dairy products. Meal planning Use more fruits and vegetables. One-half of your plate should be fruits and vegetables. Include lean proteins, such as chicken, turkey, and fish. Lifestyle Each week, aim to do one of the following: 150 minutes of moderate exercise, such as walking. 75 minutes of vigorous exercise, such as running. General information Know how many calories are in the foods you eat most often. This will help you calculate calorie counts faster. Find a way of tracking calories that works for you. Get creative. Try different apps or programs if writing down calories does not work for you. What foods should I eat?  Eat nutritious foods. It is better to have a nutritious, high-calorie food, such as an avocado, than a food with  few nutrients, such as a bag of potato chips. Use your calories on foods and drinks that will fill you up and will not leave you hungry soon after eating. Examples of foods that fill you up are nuts and nut butters, vegetables, lean proteins, and high-fiber foods such as whole grains. High-fiber foods are foods with more than 5 g of fiber per serving. Pay attention to calories in drinks. Low-calorie drinks include water and unsweetened drinks. The items listed above may not be a complete list of foods and beverages you can eat. Contact a dietitian for more information. What foods should I limit? Limit foods or drinks that are not good sources of vitamins, minerals, or protein or that are high in unhealthy fats. These   include: Candy. Other sweets. Sodas, specialty coffee drinks, alcohol, and juice. The items listed above may not be a complete list of foods and beverages you should avoid. Contact a dietitian for more information. How do I count calories when eating out? Pay attention to portions. Often, portions are much larger when eating out. Try these tips to keep portions smaller: Consider sharing a meal instead of getting your own. If you get your own meal, eat only half of it. Before you start eating, ask for a container and put half of your meal into it. When available, consider ordering smaller portions from the menu instead of full portions. Pay attention to your food and drink choices. Knowing the way food is cooked and what is included with the meal can help you eat fewer calories. If calories are listed on the menu, choose the lower-calorie options. Choose dishes that include vegetables, fruits, whole grains, low-fat dairy products, and lean proteins. Choose items that are boiled, broiled, grilled, or steamed. Avoid items that are buttered, battered, fried, or served with cream sauce. Items labeled as crispy are usually fried, unless stated otherwise. Choose water, low-fat milk,  unsweetened iced tea, or other drinks without added sugar. If you want an alcoholic beverage, choose a lower-calorie option, such as a glass of wine or light beer. Ask for dressings, sauces, and syrups on the side. These are usually high in calories, so you should limit the amount you eat. If you want a salad, choose a garden salad and ask for grilled meats. Avoid extra toppings such as bacon, cheese, or fried items. Ask for the dressing on the side, or ask for olive oil and vinegar or lemon to use as dressing. Estimate how many servings of a food you are given. Knowing serving sizes will help you be aware of how much food you are eating at restaurants. Where to find more information Centers for Disease Control and Prevention: www.cdc.gov [place] Department of Agriculture: myplate.gov Summary Calorie counting means keeping track of how many calories you eat and drink each day. If you eat fewer calories than your body needs, you should lose weight. A healthy amount of weight to lose per week is usually 1-2 lb (0.5-0.9 kg). This usually means reducing your daily calorie intake by 500-750 calories. The number of calories in a food can be found on a Nutrition Facts label. If a food does not have a Nutrition Facts label, try to look up the calories online or ask your dietitian for help. Use smaller plates, glasses, and bowls for smaller portions and to prevent overeating. Use your calories on foods and drinks that will fill you up and not leave you hungry shortly after a meal. This information is not intended to replace advice given to you by your health care provider. Make sure you discuss any questions you have with your health care provider. Document Revised: 05/22/2019 Document Reviewed: 05/22/2019 Elsevier Patient Education  2023 [employer]  

## 2022-08-01 NOTE — Progress Notes (Signed)
History of PE STD testing Weight management

## 2022-08-01 NOTE — Progress Notes (Signed)
Established Patient Office Visit  Subjective   Patient ID: Misty Long    DOB: December 15, 1995  Age: 27 y.o. MRN: [id number]  HPI  Ms. Misty Long is a 27 year old female who presents to establish care.  Her main concerns are testing for sexually transmitted diseases, weight morbid obese BMI 43.33 and has a history of pulmonary embolism. Patient has No headache, No chest pain, No abdominal pain - No Nausea, No new weakness tingling or numbness, No Cough - shortness of breath    Active Ambulatory Problems    Diagnosis Date Noted   No Active Ambulatory Problems   Resolved Ambulatory Problems    Diagnosis Date Noted   No Resolved Ambulatory Problems   Past Medical History:  Diagnosis Date   ADHD    H/O blood clots    PE (pulmonary thromboembolism)      ROS  Comprehensive ROS Pertinent positive and negative noted in HPI     Objective:   Blood Pressure 109/76   Pulse 85   Height 5\' 5"  (1.651 m)   Weight 260 lb 6.4 oz (118.1 kg)   Oxygen Saturation 100%   Body Mass Index 43.33 kg/m    Physical Exam Vitals reviewed.  Constitutional:      Appearance: She is obese.     Comments: Severe morbid obesity  HENT:     Head: Normocephalic.     Right Ear: Tympanic membrane and external ear normal.     Left Ear: Tympanic membrane and external ear normal.     Nose: Nose normal.  Eyes:     Extraocular Movements: Extraocular movements intact.     Pupils: Pupils are equal, round, and reactive to light.  Cardiovascular:     Rate and Rhythm: Normal rate and regular rhythm.  Pulmonary:     Effort: Pulmonary effort is normal.     Breath sounds: Normal breath sounds.  Abdominal:     General: Bowel sounds are normal. There is distension.     Palpations: Abdomen is soft.  Musculoskeletal:        General: Normal range of motion.     Cervical back: Normal range of motion and neck supple.  Skin:    General: Skin is warm and dry.  Neurological:     Mental Status: She is oriented to  person, place, and time.  Psychiatric:        Mood and Affect: Mood normal.        Behavior: Behavior normal.      No results found for any visits on 03/02/22.  Lab Results  Component Value Date   WBC 7.4 02/28/2022   HGB 13.7 02/28/2022   HCT 42.0 02/28/2022   MCV 80.6 02/28/2022   MCH 26.3 02/28/2022   RDW 15.4 02/28/2022   PLT 264 02/28/2022   Last metabolic panel Lab Results  Component Value Date   GLUCOSE 79 02/28/2022   NA 137 02/28/2022   K 3.8 02/28/2022   CL 105 02/28/2022   CO2 25 02/28/2022   BUN 5 (L) 02/28/2022   CREATININE 0.93 02/28/2022   GFRNONAA >60 02/28/2022   CALCIUM 8.6 (L) 02/28/2022   ANIONGAP 7 02/28/2022  The ASCVD Risk score (Arnett DK, et al., 2019) failed to calculate for the following reasons:   The systolic blood pressure is missing   Cannot find a previous HDL lab   Cannot find a previous total cholesterol lab    Assessment & Plan:  Misty Long was seen today for new patient (  initial visit).  Diagnoses and all orders for this visit:  Possible exposure to STD -     Cervicovaginal ancillary only -     HIV antibody (with reflex)  Morbidly obese Discussed diet and exercise for person with BMI >25. Instructed: You must burn more calories than you eat. Losing 5 percent of your body weight should be considered a success. In the longer term, losing more than 15 percent of your body weight and staying at this weight is an extremely good result. However, keep in mind that even losing 5 percent of your body weight leads to important health benefits, so try not to get discouraged if you're not able to lose more than this. Will recheck weight in 3-6 months. Referred to weight management -     TSH + free T4  Encounter to establish care -     CMP14+EGFR  History of pulmonary embolism  Menorrhagia with regular cycle -     CBC with Differential  Family history of diabetes mellitus in mother -     Hemoglobin A1c  Family history of breast  cancer    No follow-ups on file.    Grayce Sessions, NP

## 2022-08-02 LAB — CBC WITH DIFFERENTIAL/PLATELET
Basophils Absolute: 0 10*3/uL (ref 0.0–0.2)
Basos: 1 %
EOS (ABSOLUTE): 0.3 10*3/uL (ref 0.0–0.4)
Eos: 4 %
Hematocrit: 45.3 % (ref 34.0–46.6)
Hemoglobin: 14.1 g/dL (ref 11.1–15.9)
Immature Grans (Abs): 0 10*3/uL (ref 0.0–0.1)
Immature Granulocytes: 0 %
Lymphocytes Absolute: 2.9 10*3/uL (ref 0.7–3.1)
Lymphs: 41 %
MCH: 25.7 pg — ABNORMAL LOW (ref 26.6–33.0)
MCHC: 31.1 g/dL — ABNORMAL LOW (ref 31.5–35.7)
MCV: 83 fL (ref 79–97)
Monocytes Absolute: 0.5 10*3/uL (ref 0.1–0.9)
Monocytes: 8 %
Neutrophils Absolute: 3.3 10*3/uL (ref 1.4–7.0)
Neutrophils: 46 %
Platelets: 275 10*3/uL (ref 150–450)
RBC: 5.49 x10E6/uL — ABNORMAL HIGH (ref 3.77–5.28)
RDW: 13.8 % (ref 11.7–15.4)
WBC: 7.1 10*3/uL (ref 3.4–10.8)

## 2022-08-02 LAB — CMP14+EGFR
ALT: 14 IU/L (ref 0–32)
AST: 15 IU/L (ref 0–40)
Albumin/Globulin Ratio: 1.4 (ref 1.2–2.2)
Albumin: 4.2 g/dL (ref 4.0–5.0)
Alkaline Phosphatase: 97 IU/L (ref 44–121)
BUN/Creatinine Ratio: 11 (ref 9–23)
BUN: 9 mg/dL (ref 6–20)
Bilirubin Total: <0.2 mg/dL (ref 0.0–1.2)
CO2: 25 mmol/L (ref 20–29)
Calcium: 9.3 mg/dL (ref 8.7–10.2)
Chloride: 100 mmol/L (ref 96–106)
Creatinine, Ser: 0.82 mg/dL (ref 0.57–1.00)
Globulin, Total: 2.9 g/dL (ref 1.5–4.5)
Glucose: 84 mg/dL (ref 70–99)
Potassium: 4.3 mmol/L (ref 3.5–5.2)
Sodium: 139 mmol/L (ref 134–144)
Total Protein: 7.1 g/dL (ref 6.0–8.5)
eGFR: 102 mL/min/{1.73_m2} (ref >59)

## 2022-08-02 LAB — TSH+FREE T4
Free T4: 0.9 ng/dL (ref 0.82–1.77)
TSH: 1.03 u[IU]/mL (ref 0.450–4.500)

## 2022-08-02 LAB — HEMOGLOBIN A1C
Est. average glucose Bld gHb Est-mCnc: 120 mg/dL
Hgb A1c MFr Bld: 5.8 % — ABNORMAL HIGH (ref 4.8–5.6)

## 2022-08-02 LAB — HIV ANTIBODY (ROUTINE TESTING W REFLEX): HIV Screen 4th Generation wRfx: NONREACTIVE

## 2022-08-03 ENCOUNTER — Other Ambulatory Visit (INDEPENDENT_AMBULATORY_CARE_PROVIDER_SITE_OTHER): Payer: Self-pay | Admitting: Primary Care

## 2022-08-03 DIAGNOSIS — B9689 Other specified bacterial agents as the cause of diseases classified elsewhere: Secondary | ICD-10-CM

## 2022-08-03 DIAGNOSIS — B379 Candidiasis, unspecified: Secondary | ICD-10-CM

## 2022-08-03 LAB — CERVICOVAGINAL ANCILLARY ONLY
Bacterial Vaginitis (gardnerella): POSITIVE — AB
Candida Glabrata: NEGATIVE
Candida Vaginitis: POSITIVE — AB
Chlamydia: NEGATIVE
Comment: NEGATIVE
Comment: NEGATIVE
Comment: NEGATIVE
Comment: NEGATIVE
Comment: NEGATIVE
Comment: NORMAL
Neisseria Gonorrhea: NEGATIVE
Trichomonas: NEGATIVE

## 2022-08-03 MED ORDER — FLUCONAZOLE 150 MG PO TABS: 150.0000 mg | ORAL_TABLET | Freq: Every day | ORAL | 1 refills | Status: DC

## 2022-08-03 MED ORDER — METRONIDAZOLE 500 MG PO TABS: 500.0000 mg | ORAL_TABLET | Freq: Two times a day (BID) | ORAL | 0 refills | Status: DC

## 2022-10-31 ENCOUNTER — Encounter (INDEPENDENT_AMBULATORY_CARE_PROVIDER_SITE_OTHER): Payer: Self-pay | Admitting: Primary Care

## 2022-10-31 ENCOUNTER — Ambulatory Visit (INDEPENDENT_AMBULATORY_CARE_PROVIDER_SITE_OTHER): Payer: Commercial Managed Care - PPO | Admitting: Primary Care

## 2022-10-31 NOTE — Addendum Note (Signed)
Addended by: Gwinda Passe on: 10/31/2022 03:56 PM   Modules accepted: Orders

## 2022-10-31 NOTE — Progress Notes (Signed)
  [facility]  [name], is a 27 y.o. female  ZOX:[id number]  WJX:[id number]  DOB - 06-02-1995  weight      Subjective:  Ms. Misty Long is a 27 y.o. female here today for initially weight management.  However from the last visit until now she has been laid off and going to personal depression and anxiety related to poor self esteem and not able to loose weight  despite changing her diet and exercising . She is now employed therefore ready to start this weight loss journey .  Patient has No headache, No chest pain, No abdominal pain - No Nausea, No new weakness tingling or numbness, No Cough - shortness of breath  No problems updated.  Allergies  Allergen Reactions   Codeine     Past Medical History:  Diagnosis Date   ADHD    H/O blood clots    PE (pulmonary thromboembolism) (HCC)     Current Outpatient Medications on File Prior to Visit  Medication Sig Dispense Refill   fluconazole (DIFLUCAN) 150 MG tablet Take 1 tablet (150 mg total) by mouth daily. (Patient not taking: Reported on 10/31/2022) 1 tablet 1   metroNIDAZOLE (FLAGYL) 500 MG tablet Take 1 tablet (500 mg total) by mouth 2 (two) times daily. (Patient not taking: Reported on 10/31/2022) 14 tablet 0   No current facility-administered medications on file prior to visit.    Objective:   Vitals:   10/31/22 1428  BP: 130/73  Pulse: 66  Resp: 16  SpO2: 99%  Weight: 257 lb 6.4 oz (116.8 kg)    Comprehensive ROS Pertinent positive and negative noted in HPI   Exam General appearance : Awake, alert, not in any distress. Speech Clear. Not toxic looking HEENT: Atraumatic and Normocephalic, pupils equally reactive to light and accomodation Neck: Supple, no JVD. No cervical lymphadenopathy.  Chest: Good air entry bilaterally, no added sounds  CVS: S1 S2 regular, no murmurs.  Abdomen: Bowel sounds present, Non tender and not distended with no gaurding, rigidity or rebound. Extremities: B/L Lower  Ext shows no edema, both legs are warm to touch Neurology: Awake alert, and oriented X 3, CN II-XII intact, Non focal Skin: No Rash  Data Review Lab Results  Component Value Date   HGBA1C 5.8 (H) 08/01/2022    Assessment & Plan  Diagnoses and all orders for this visit:  Morbidly obese (HCC) Morbid Obesity is > 40 indicating an excess in caloric intake or underlining conditions. This may lead to other co-morbidities. Educated on lifestyle modifications of diet and exercise which may reduce obesity.   [facility] care and consulting [phone]    Patient have been counseled extensively about nutrition and exercise. Other issues discussed during this visit include: low cholesterol diet, weight control and daily exercise, foot care, annual eye examinations at Ophthalmology, importance of adherence with medications and regular follow-up. We also discussed long term complications of uncontrolled diabetes and hypertension.   This note has been created with Education officer, environmental. Any transcriptional errors are unintentional.   Grayce Sessions, NP 10/31/2022, 3:09 PM

## 2022-12-07 ENCOUNTER — Ambulatory Visit: Payer: Commercial Managed Care - PPO | Admitting: Family Medicine

## 2023-01-31 ENCOUNTER — Ambulatory Visit (INDEPENDENT_AMBULATORY_CARE_PROVIDER_SITE_OTHER): Payer: Commercial Managed Care - PPO | Admitting: Primary Care

## 2023-02-06 ENCOUNTER — Encounter (INDEPENDENT_AMBULATORY_CARE_PROVIDER_SITE_OTHER): Payer: Self-pay | Admitting: Primary Care

## 2023-02-06 ENCOUNTER — Other Ambulatory Visit (HOSPITAL_COMMUNITY)
Admission: RE | Admit: 2023-02-06 | Discharge: 2023-02-06 | Disposition: A | Discharge status: Home / Self Care | Payer: Commercial Managed Care - PPO | Source: Ambulatory Visit | Attending: Primary Care | Admitting: Primary Care

## 2023-02-06 ENCOUNTER — Ambulatory Visit (INDEPENDENT_AMBULATORY_CARE_PROVIDER_SITE_OTHER): Payer: Commercial Managed Care - PPO | Admitting: Primary Care

## 2023-02-06 VITALS — BP 118/76 | HR 76 | Resp 16 | Wt 235.4 lb

## 2023-02-06 DIAGNOSIS — N760 Acute vaginitis: Secondary | ICD-10-CM | POA: Diagnosis not present

## 2023-02-06 DIAGNOSIS — Z6837 Body mass index (BMI) 37.0-37.9, adult: Secondary | ICD-10-CM | POA: Diagnosis not present

## 2023-02-06 DIAGNOSIS — B9689 Other specified bacterial agents as the cause of diseases classified elsewhere: Secondary | ICD-10-CM | POA: Diagnosis not present

## 2023-02-06 DIAGNOSIS — Z124 Encounter for screening for malignant neoplasm of cervix: Secondary | ICD-10-CM | POA: Insufficient documentation

## 2023-02-06 DIAGNOSIS — Z1159 Encounter for screening for other viral diseases: Secondary | ICD-10-CM | POA: Diagnosis not present

## 2023-02-06 DIAGNOSIS — E66812 Obesity, class 2: Secondary | ICD-10-CM

## 2023-02-06 DIAGNOSIS — Z2821 Immunization not carried out because of patient refusal: Secondary | ICD-10-CM

## 2023-02-06 DIAGNOSIS — Z114 Encounter for screening for human immunodeficiency virus [HIV]: Secondary | ICD-10-CM | POA: Insufficient documentation

## 2023-02-06 MED ORDER — TIRZEPATIDE 2.5 MG/0.5ML ~~LOC~~ SOAJ
2.5000 mg | SUBCUTANEOUS | 3 refills | Status: DC
Start: 2023-02-06 — End: 2023-02-14

## 2023-02-06 NOTE — Progress Notes (Signed)
  [facility]  [facility] Patient name: Misty Long MRN [id number]  Date of birth: 1995/08/29 Chief Complaint:   Weight Management Screening and Gynecologic Exam  History of Present Illness:   Misty Long is a 27 y.o. No obstetric history on file. female being seen today for a routine well-woman exam.   CC: weight   The current method of family planning is  paraiguard .  No LMP recorded. (Menstrual status: IUD). Last pap unknown . Results were: unknown  Last mammogram: none.  Family h/o breast cancer: Yes Last colonoscopy: none.  Family h/o colorectal cancer: Yes  Review of Systems:    Denies any headaches, blurred vision, fatigue, shortness of breath, chest pain, abdominal pain, abnormal vaginal discharge/itching/odor/irritation, problems with periods, bowel movements, urination, or intercourse unless otherwise stated above.  Pertinent History Reviewed:   Reviewed past medical,surgical, social and family history.  Reviewed problem list, medications and allergies.  Physical Assessment:   Vitals:   02/06/23 0940  BP: 118/76  Pulse: 76  Resp: 16  SpO2: 97%  Weight: 235 lb 6.4 oz (106.8 kg)  Body mass index is 39.17 kg/m.        Physical Examination:  General appearance - well appearing, and in no distress Mental status - alert, oriented to person, place, and time Psych:  She has a normal mood and affect Skin - warm and dry, normal color, no suspicious lesions noted Chest - effort normal, all lung fields clear to auscultation bilaterally Heart - normal rate and regular rhythm Neck:  midline trachea, no thyromegaly or nodules Breasts - breasts appear normal, no suspicious masses, no skin or nipple changes or axillary nodes Educated patient on proper self breast examination and had patient to demonstrate SBE. Abdomen - soft, nontender, nondistended, no masses or organomegaly Pelvic-VULVA: normal appearing vulva with no masses, tenderness  or lesions   VAGINA: normal appearing vagina with normal color and discharge, no lesions   CERVIX: normal appearing cervix without discharge or lesions, no CMT UTERUS: uterus is felt to be normal size, shape, consistency and nontender  ADNEXA: No adnexal masses or tenderness noted. Extremities:  No swelling or varicosities noted  No results found for this or any previous visit (from the past 24 hour(s)).   Assessment & Plan:  Misty Long was seen today for weight management screening and gynecologic exam.  Diagnoses and all orders for this visit:  Influenza vaccination declined  Tetanus, diphtheria, and acellular pertussis (Tdap) vaccination declined  Cervical cancer screening -     Cervicovaginal ancillary only -     Cytology - PAP  Encounter for HCV screening test for low risk patient -     HCV Ab w Reflex to Quant PCR    Orders Placed This Encounter  Procedures   HCV Ab w Reflex to Quant PCR       Follow-up: No follow-ups on file.  This note has been created with Education officer, environmental. Any transcriptional errors are unintentional.   Grayce Sessions, NP 02/06/2023, 10:15 AM

## 2023-02-07 ENCOUNTER — Other Ambulatory Visit (INDEPENDENT_AMBULATORY_CARE_PROVIDER_SITE_OTHER): Payer: Self-pay | Admitting: Primary Care

## 2023-02-07 DIAGNOSIS — B9689 Other specified bacterial agents as the cause of diseases classified elsewhere: Secondary | ICD-10-CM

## 2023-02-07 DIAGNOSIS — B379 Candidiasis, unspecified: Secondary | ICD-10-CM

## 2023-02-07 LAB — CERVICOVAGINAL ANCILLARY ONLY
Bacterial Vaginitis (gardnerella): POSITIVE — AB
Candida Glabrata: NEGATIVE
Candida Vaginitis: NEGATIVE
Chlamydia: NEGATIVE
Comment: NEGATIVE
Comment: NEGATIVE
Comment: NEGATIVE
Comment: NEGATIVE
Comment: NEGATIVE
Comment: NORMAL
Neisseria Gonorrhea: NEGATIVE
Trichomonas: NEGATIVE

## 2023-02-07 MED ORDER — METRONIDAZOLE 500 MG PO TABS
500.0000 mg | ORAL_TABLET | Freq: Two times a day (BID) | ORAL | 0 refills | Status: DC
Start: 2023-02-07 — End: 2023-02-14

## 2023-02-07 MED ORDER — FLUCONAZOLE 150 MG PO TABS
150.0000 mg | ORAL_TABLET | Freq: Every day | ORAL | 1 refills | Status: DC
Start: 2023-02-07 — End: 2023-02-14

## 2023-02-08 LAB — CYTOLOGY - PAP
Adequacy: ABSENT
Comment: NEGATIVE
Diagnosis: NEGATIVE
High risk HPV: NEGATIVE

## 2023-02-08 LAB — HCV INTERPRETATION

## 2023-02-08 LAB — HCV AB W REFLEX TO QUANT PCR: HCV Ab: NONREACTIVE

## 2023-02-09 ENCOUNTER — Telehealth (INDEPENDENT_AMBULATORY_CARE_PROVIDER_SITE_OTHER): Payer: Self-pay

## 2023-02-09 NOTE — Telephone Encounter (Signed)
Contacted pt to go over lab results pt is aware and doesn't have any questions or concerns 

## 2023-02-14 ENCOUNTER — Other Ambulatory Visit (INDEPENDENT_AMBULATORY_CARE_PROVIDER_SITE_OTHER): Payer: Self-pay

## 2023-02-14 DIAGNOSIS — B379 Candidiasis, unspecified: Secondary | ICD-10-CM

## 2023-02-14 DIAGNOSIS — B9689 Other specified bacterial agents as the cause of diseases classified elsewhere: Secondary | ICD-10-CM

## 2023-02-14 MED ORDER — FLUCONAZOLE 150 MG PO TABS: 150.0000 mg | ORAL_TABLET | Freq: Every day | ORAL | 1 refills | Status: AC

## 2023-02-14 MED ORDER — TIRZEPATIDE 5 MG/0.5ML ~~LOC~~ SOAJ: 5.0000 mg | SUBCUTANEOUS | 3 refills | Status: AC

## 2023-02-14 MED ORDER — METRONIDAZOLE 500 MG PO TABS: 500.0000 mg | ORAL_TABLET | Freq: Two times a day (BID) | ORAL | 0 refills | Status: AC

## 2023-02-22 ENCOUNTER — Encounter: Payer: Commercial Managed Care - PPO | Admitting: Family Medicine

## 2023-03-09 ENCOUNTER — Ambulatory Visit (INDEPENDENT_AMBULATORY_CARE_PROVIDER_SITE_OTHER): Payer: Self-pay | Admitting: Primary Care

## 2023-10-02 ENCOUNTER — Telehealth (INDEPENDENT_AMBULATORY_CARE_PROVIDER_SITE_OTHER): Payer: Self-pay

## 2023-10-02 NOTE — Telephone Encounter (Signed)
 The patient would like to be contacted by a member of clinical staff when possible to discuss increasing their prescription for tirzepatide  (MOUNJARO ) 5 MG/0.5ML Pen [[id number]]        Please contact further if/when possible    Will forward to provider

## 2023-10-04 NOTE — Telephone Encounter (Signed)
Please reach out to pt?

## 2023-10-05 NOTE — Telephone Encounter (Signed)
 Tried contacting pt to schedule an appt pt didn't answer lvm   *If pt calls back please schedule an appt*

## 2024-02-05 IMAGING — CR DG KNEE COMPLETE 4+V*R*
4 series · 4 of 4 positions shown · non-contrast
Comparison: None Available.

CLINICAL DATA: Knee injury

EXAM:
RIGHT KNEE - COMPLETE 4+ VIEW

[knee obl (1 of 2)]
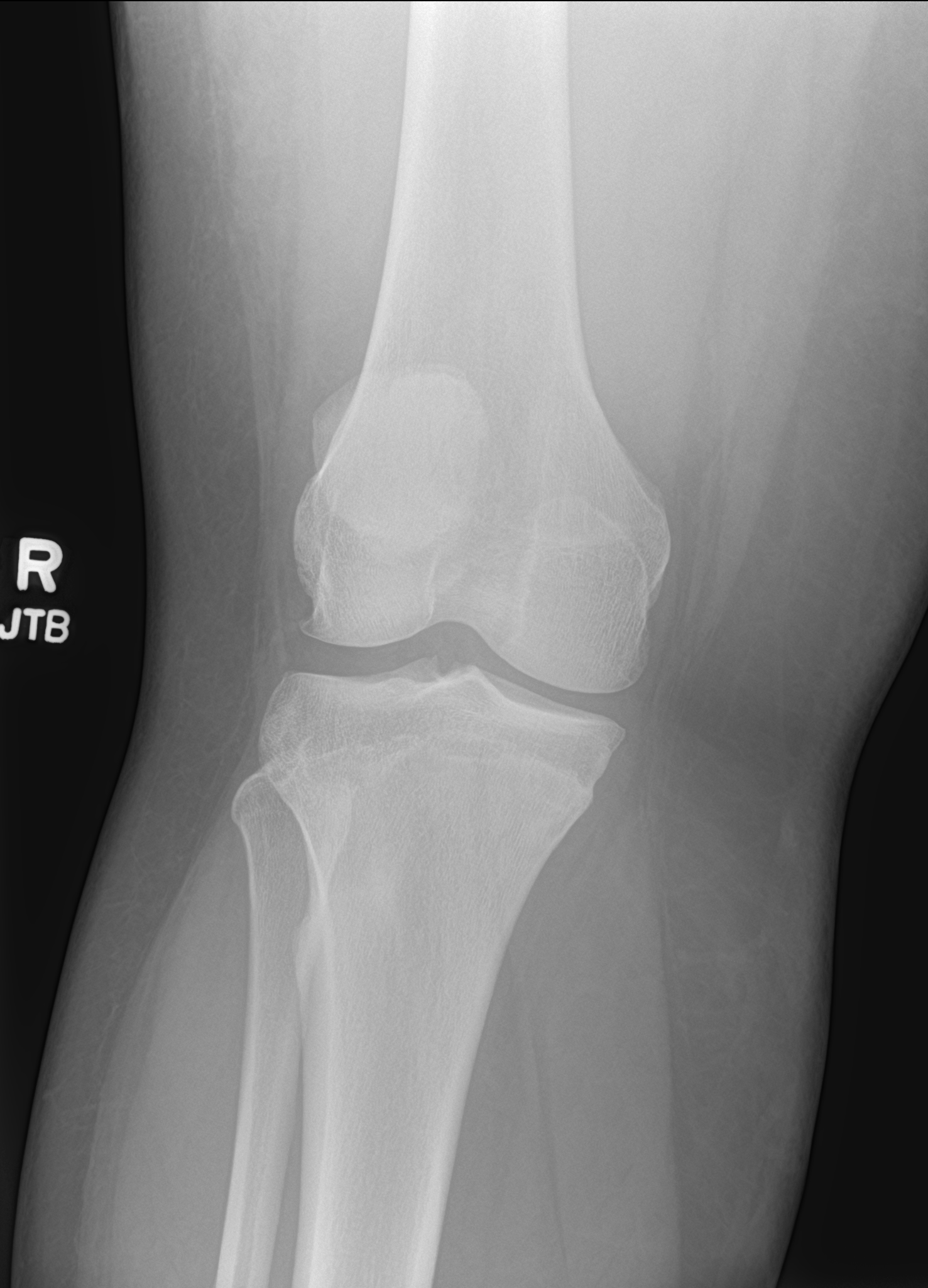

[knee lat]
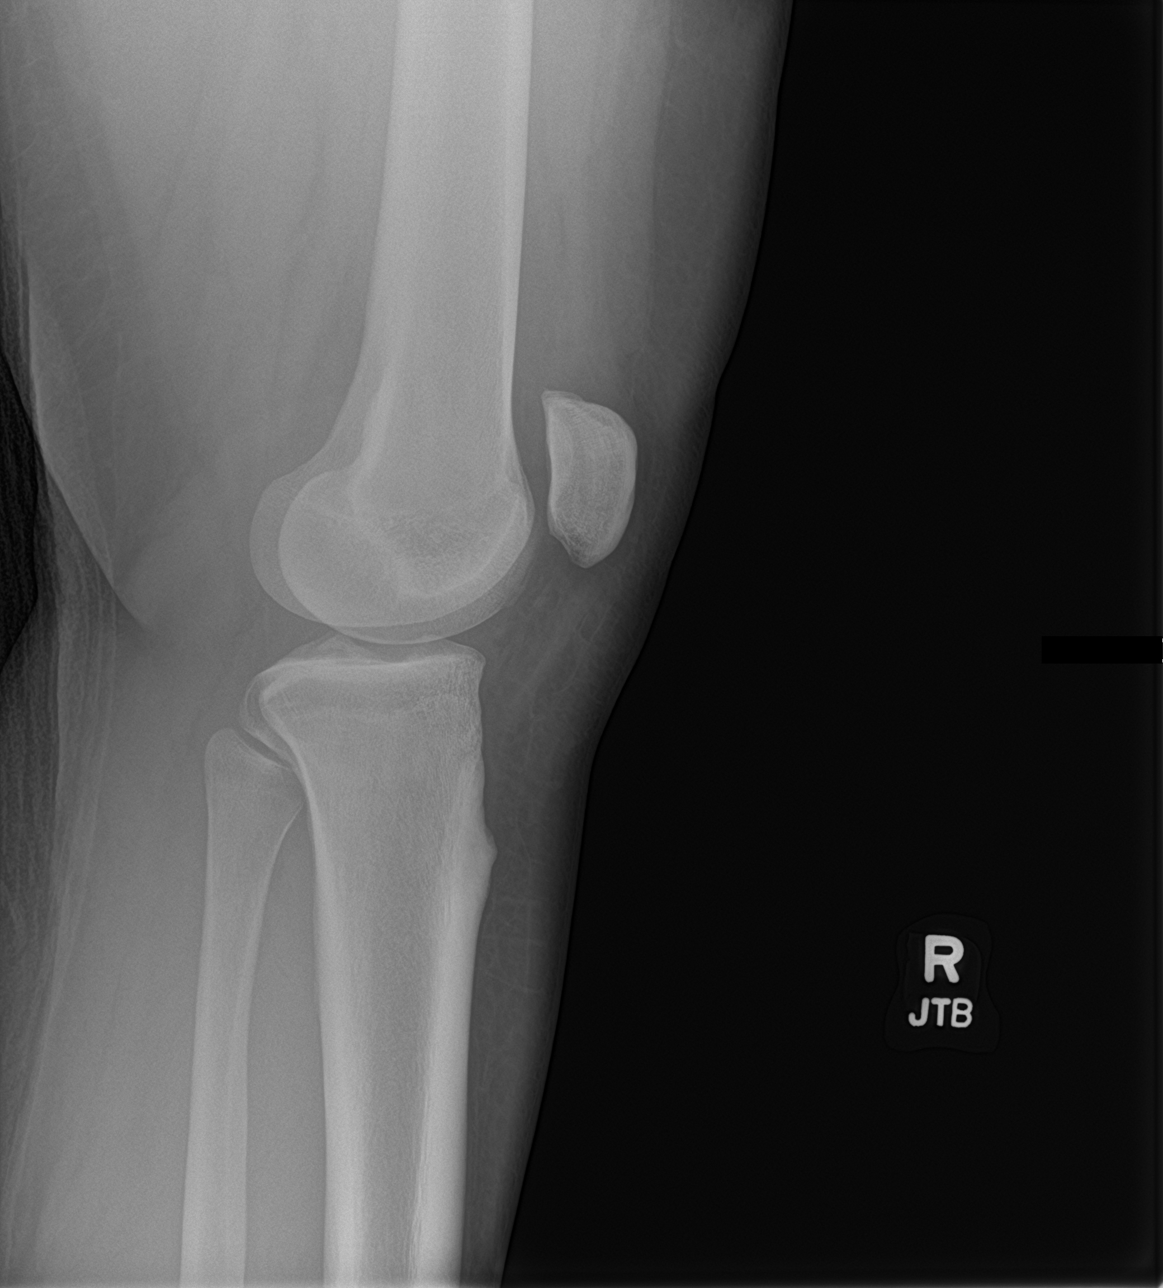

[knee ap]
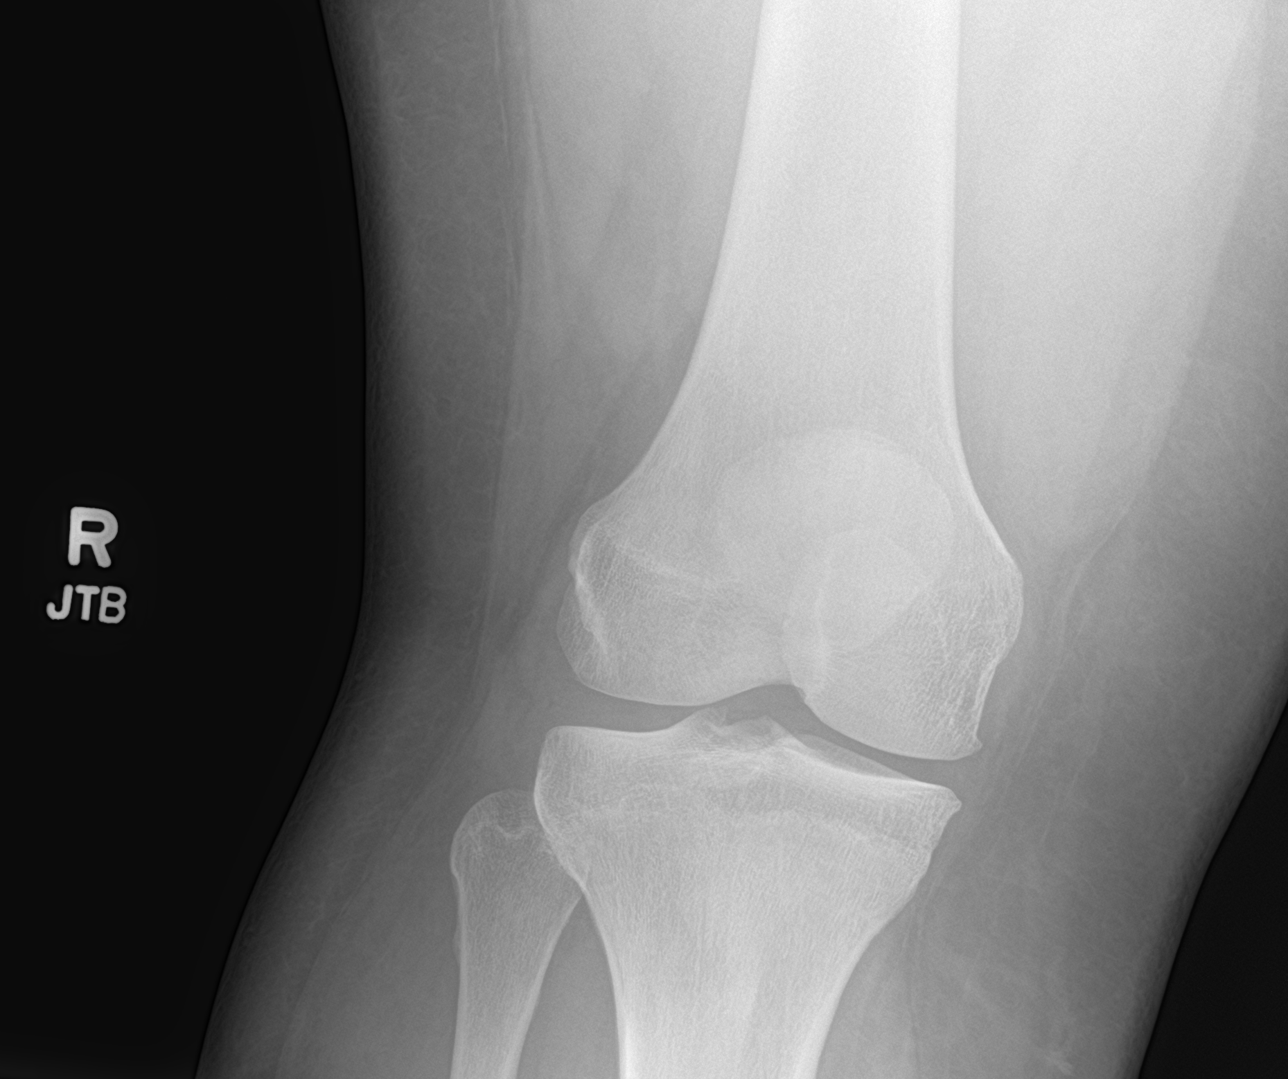

[knee obl (2 of 2)]
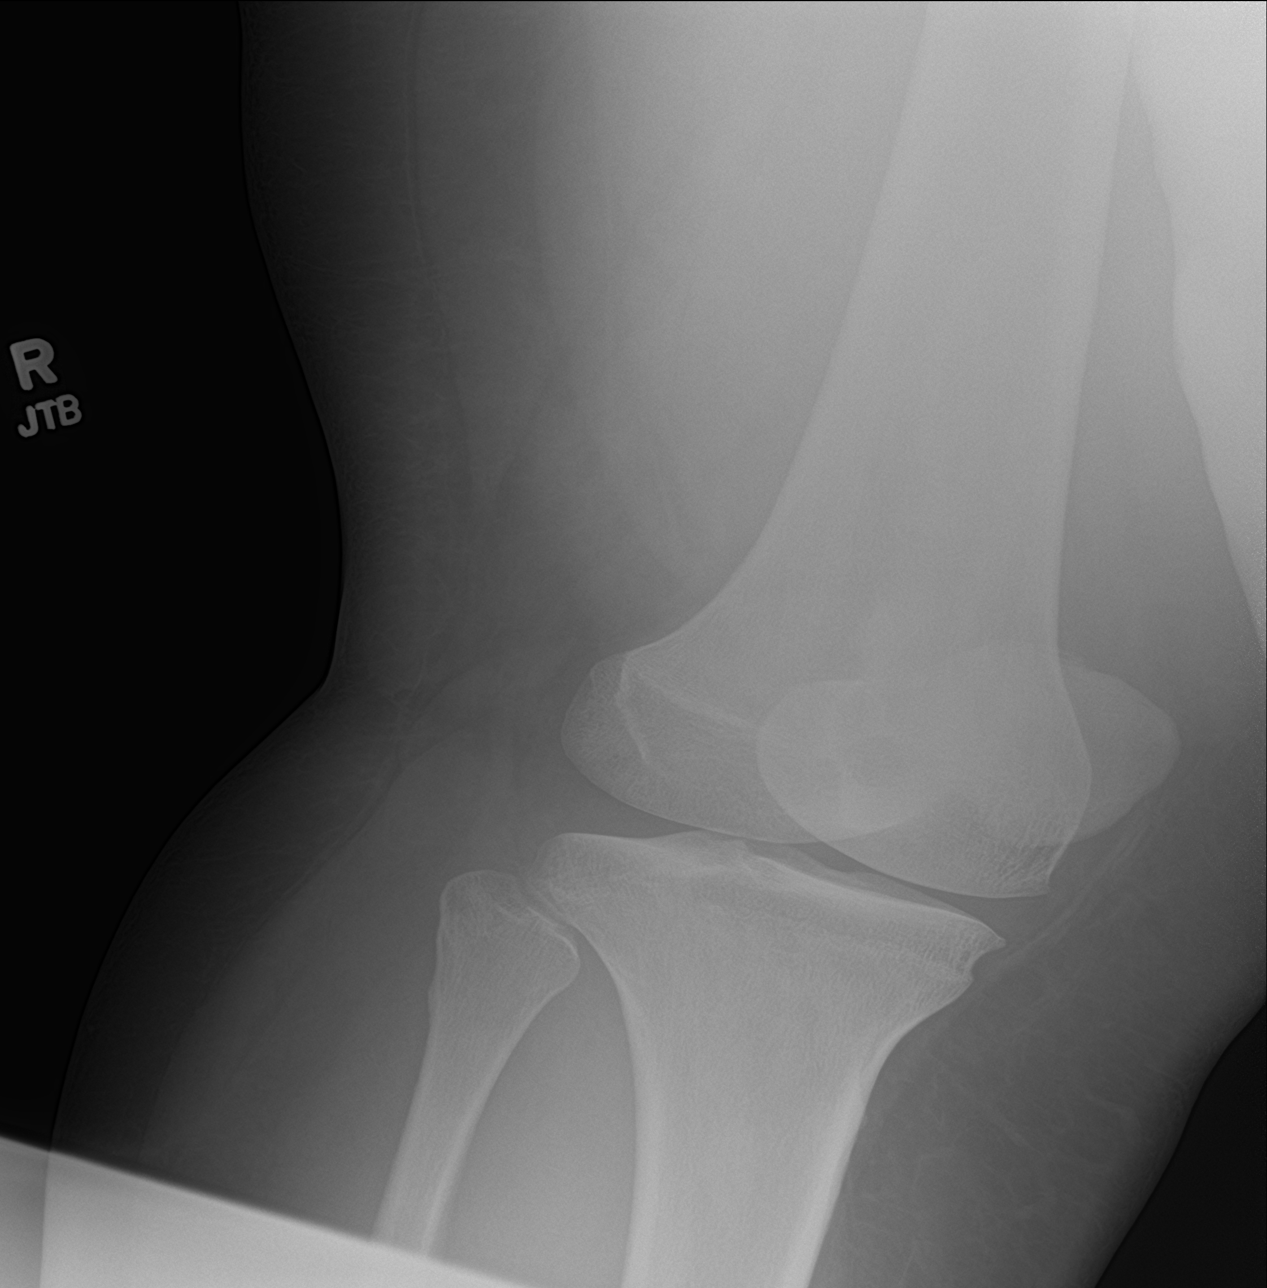

[4 of 4 positions shown; findings below may reference images not displayed]

FINDINGS: No evidence of fracture, dislocation, or joint effusion. No evidence
of arthropathy or other focal bone abnormality. Soft tissues are
unremarkable.
IMPRESSION: Negative.
